# Patient Record
Sex: Female | Born: 1985 | Race: White | Hispanic: No | Marital: Married | Smoking: Never smoker
Health system: Southern US, Community
[De-identification: ages and names within clinical notes are randomized; demographics above are authoritative.]

## PROBLEM LIST (undated history)

## (undated) DIAGNOSIS — Z8759 Personal history of other complications of pregnancy, childbirth and the puerperium: Secondary | ICD-10-CM

## (undated) HISTORY — DX: Personal history of other complications of pregnancy, childbirth and the puerperium: Z87.59

## (undated) HISTORY — PX: WISDOM TOOTH EXTRACTION: SHX21

---

## 2021-08-14 ENCOUNTER — Encounter: Payer: Self-pay | Admitting: *Deleted

## 2021-08-23 ENCOUNTER — Other Ambulatory Visit: Payer: Self-pay

## 2021-08-23 ENCOUNTER — Encounter: Payer: Self-pay | Admitting: Certified Nurse Midwife

## 2021-08-23 ENCOUNTER — Other Ambulatory Visit (HOSPITAL_COMMUNITY)
Admission: RE | Admit: 2021-08-23 | Discharge: 2021-08-23 | Disposition: A | Discharge status: Home / Self Care | Payer: Medicaid Other | Source: Ambulatory Visit | Attending: Certified Nurse Midwife | Admitting: Certified Nurse Midwife

## 2021-08-23 ENCOUNTER — Ambulatory Visit (INDEPENDENT_AMBULATORY_CARE_PROVIDER_SITE_OTHER): Payer: Medicaid Other | Admitting: Certified Nurse Midwife

## 2021-08-23 VITALS — BP 104/62 | HR 67 | Ht 62.0 in | Wt 161.0 lb

## 2021-08-23 DIAGNOSIS — O0930 Supervision of pregnancy with insufficient antenatal care, unspecified trimester: Secondary | ICD-10-CM

## 2021-08-23 DIAGNOSIS — Z8759 Personal history of other complications of pregnancy, childbirth and the puerperium: Secondary | ICD-10-CM | POA: Insufficient documentation

## 2021-08-23 DIAGNOSIS — O09522 Supervision of elderly multigravida, second trimester: Secondary | ICD-10-CM

## 2021-08-23 DIAGNOSIS — O09299 Supervision of pregnancy with other poor reproductive or obstetric history, unspecified trimester: Secondary | ICD-10-CM

## 2021-08-23 DIAGNOSIS — Z3A21 21 weeks gestation of pregnancy: Secondary | ICD-10-CM

## 2021-08-23 DIAGNOSIS — Z3480 Encounter for supervision of other normal pregnancy, unspecified trimester: Secondary | ICD-10-CM

## 2021-08-23 DIAGNOSIS — O09529 Supervision of elderly multigravida, unspecified trimester: Secondary | ICD-10-CM | POA: Insufficient documentation

## 2021-08-23 NOTE — Progress Notes (Signed)
Subjective:   Mikayla Nash is a 35 y.o. [id number] at [redacted]w[redacted]d by LMP being seen today for her first obstetrical visit.  Her obstetrical history is significant for advanced maternal age. Patient does intend to breast feed. Pregnancy history fully reviewed.  Patient reports no complaints.  HISTORY: OB History  Gravida Para Term Preterm AB Living  6 3 3  0 2 3  SAB IAB Ectopic Multiple Live Births  2 0 0 0 0    # Outcome Date GA Lbr Len/2nd Weight Sex Delivery Anes PTL Lv  6 Current           5 SAB 12/2020 [redacted]w[redacted]d         4 SAB 08/2020 [redacted]w[redacted]d         3 Term 03/25/19 [redacted]w[redacted]d  8 lb 9.2 oz (3.89 kg) M Vag-Spont     2 Term 07/11/16 [redacted]w[redacted]d  10 lb 4 oz (4.65 kg) M Vag-Spont        Birth Comments: SD  1 Term 09/27/14 [redacted]w[redacted]d  8 lb 4.3 oz (3.75 kg) M Vag-Spont      Past Medical History:  Diagnosis Date  . History of miscarriage   . Shoulder dystocia during labor and delivery    Past Surgical History:  Procedure Laterality Date  . WISDOM TOOTH EXTRACTION     Family History  Problem Relation Age of Onset  . Diabetes Maternal Grandmother   . Diabetes Maternal Grandfather   . Dementia Paternal Grandmother    Social History   Tobacco Use  . Smoking status: Never  . Smokeless tobacco: Never  Vaping Use  . Vaping Use: Never used  Substance Use Topics  . Alcohol use: Never  . Drug use: Never   Not on File Current Outpatient Medications on File Prior to Visit  Medication Sig Dispense Refill  . aspirin EC 81 MG tablet Take 81 mg by mouth daily. Swallow whole.    . Prenatal Vit-Fe Fumarate-FA (MULTIVITAMIN-PRENATAL) 27-0.8 MG TABS tablet Take 1 tablet by mouth daily at 12 noon.    . progesterone (PROMETRIUM) 200 MG capsule Take 200 mg by mouth daily.     No current facility-administered medications on file prior to visit.    Indications for ASA therapy (per uptodate) One of the following: Previous pregnancy with preeclampsia, especially early onset and with an adverse outcome  No Multifetal gestation No Chronic hypertension No Type 1 or 2 diabetes mellitus No Chronic kidney disease No Autoimmune disease (antiphospholipid syndrome, systemic lupus erythematosus) No  Two or more of the following: Nulliparity No Obesity (body mass index >30 kg/m2) No Family history of preeclampsia in mother or sister No Age ?35 years Yes Sociodemographic characteristics (African American race, low socioeconomic level) No Personal risk factors (eg, previous pregnancy with low birth weight or small for gestational age infant, previous adverse pregnancy outcome [eg, stillbirth], interval >10 years between pregnancies) No  Indications for early 1 hour GTT (per uptodate)  BMI >25 (>23 in Asian women) AND one of the following  Gestational diabetes mellitus in a previous pregnancy No Glycated hemoglobin ?5.7 percent (39 mmol/mol), impaired glucose tolerance, or impaired fasting glucose on previous testing No First-degree relative with diabetes No High-risk race/ethnicity (eg, African American, Latino, Native American, M American, [place] Islander) No History of cardiovascular disease No Hypertension or on therapy for hypertension No High-density lipoprotein cholesterol level <35 mg/dL ([place] mmol/L) and/or a triglyceride level >250 mg/dL (1.24 mmol/L) No Polycystic ovary syndrome No Physical inactivity No Other  clinical condition associated with insulin resistance (eg, severe obesity, acanthosis nigricans) No Previous birth of an infant weighing ?4000 g Yes Previous stillbirth of unknown cause No   Exam   Vitals:   08/23/21 0933 08/23/21 0934  BP: 104/62   Pulse: 67   Weight: 161 lb (73 kg)   Height:  5\' 2"  (1.575 m)   Fetal Heart Rate (bpm): 156  Uterus:  Fundal Height: 22 cm  Pelvic Exam: Perineum:    Vulva:    Vagina:     Cervix:    Adnexa:    Bony Pelvis:   System: General: well-developed, well-nourished female in no acute distress   Breast:     Skin:  normal coloration and turgor, no rashes   Neurologic: oriented, normal, negative, normal mood   Extremities: normal strength, tone, and muscle mass, ROM of all joints is normal   HEENT extraocular movement intact and sclera clear, anicteric   Mouth/Teeth mucous membranes moist, pharynx normal without lesions and dental hygiene good   Neck supple and no masses   Cardiovascular: regular rate    Respiratory:  no respiratory distress, normal WOB   Abdomen: soft, non-tender; bowel sounds normal; no masses,  no organomegaly     Assessment:   Pregnancy: Patient Active Problem List   Diagnosis Date Noted  . Supervision of other normal pregnancy, antepartum 08/23/2021  . Advanced maternal age in multigravida 08/23/2021  . History of shoulder dystocia in prior pregnancy 08/23/2021  . Late prenatal care 08/23/2021  . History of macrosomia in infant in prior pregnancy, currently pregnant 08/23/2021     Plan:  1. Supervision of other normal pregnancy, antepartum - Obstetric panel - HIV antibody (with reflex) - Hepatitis C Antibody - Culture, OB Urine - GC/Chlamydia probe amp ([place])not at [facility] MFM OB DETAIL +14 WK; Future - Hgb A1C  2. Multigravida of advanced maternal age in second trimester - [place] MFM OB DETAIL +14 WK; Future  3. [redacted] weeks gestation of pregnancy  4. History of shoulder dystocia in prior pregnancy - weight 10'4, no GDM, reports no injury to baby  5. Late prenatal care - moved from OR  6. History of macrosomia in infant in prior pregnancy, currently pregnant - reports extra weight gain - no GDM - Hgb A1C   Initial labs drawn. Early A1C Has been taking bASA for previous SAB x2, ok to continue, may reduce risk of PEC Has been taking Prometrium, ok to stop, no benefit at this GA Continue prenatal vitamins. Genetic Screening discussed, First trimester screen, Quad screen and NIPS: declined. Ultrasound discussed; fetal anatomic survey:  requested. Problem list reviewed and updated The nature of [facility] with multiple MDs and other Advanced Practice Providers was explained to patient; also emphasized that fellows, residents, and students are part of our team. Desires midwives and low intervention Routine obstetric precautions reviewed Return in about 4 weeks (around 09/20/2021).   09/22/2021 11:56 AM 08/23/21

## 2021-08-23 NOTE — Progress Notes (Signed)
Pt declined genetic testing  Last pap 05/12/19- normal results

## 2021-08-26 LAB — GC/CHLAMYDIA PROBE AMP (~~LOC~~) NOT AT ARMC
Chlamydia: NEGATIVE
Comment: NEGATIVE
Comment: NORMAL
Neisseria Gonorrhea: NEGATIVE

## 2021-08-30 LAB — OBSTETRIC PANEL
Absolute Monocytes: 475 cells/uL (ref 200–950)
Antibody Screen: NOT DETECTED
Basophils Absolute: 43 cells/uL (ref 0–200)
Basophils Relative: 0.6 %
Eosinophils Absolute: 29 cells/uL (ref 15–500)
Eosinophils Relative: 0.4 %
HCT: 36.1 % (ref 35.0–45.0)
Hemoglobin: 12 g/dL (ref 11.7–15.5)
Hepatitis B Surface Ag: NONREACTIVE
Lymphs Abs: 1454 cells/uL (ref 850–3900)
MCH: 31.2 pg (ref 27.0–33.0)
MCHC: 33.2 g/dL (ref 32.0–36.0)
MCV: 93.8 fL (ref 80.0–100.0)
MPV: 10.6 fL (ref 7.5–12.5)
Monocytes Relative: 6.6 %
Neutro Abs: 5198 cells/uL (ref 1500–7800)
Neutrophils Relative %: 72.2 %
Platelets: 258 10*3/uL (ref 140–400)
RBC: 3.85 10*6/uL (ref 3.80–5.10)
RDW: 12.5 % (ref 11.0–15.0)
RPR Ser Ql: NONREACTIVE
Rubella: 3.03 Index
Total Lymphocyte: 20.2 %
WBC: 7.2 10*3/uL (ref 3.8–10.8)

## 2021-08-30 LAB — HEMOGLOBIN A1C
Hgb A1c MFr Bld: 4.4 % of total Hgb (ref <5.7)
Mean Plasma Glucose: 80 mg/dL
eAG (mmol/L): 4.4 mmol/L

## 2021-08-30 LAB — HEPATITIS C ANTIBODY
Hepatitis C Ab: NONREACTIVE
SIGNAL TO CUT-OFF: 0.09 (ref <1.00)

## 2021-08-30 LAB — CULTURE, OB URINE

## 2021-08-30 LAB — URINE CULTURE, OB REFLEX

## 2021-08-30 LAB — HIV ANTIBODY (ROUTINE TESTING W REFLEX): HIV 1&2 Ab, 4th Generation: NONREACTIVE

## 2021-09-03 ENCOUNTER — Encounter: Payer: Self-pay | Admitting: Certified Nurse Midwife

## 2021-09-03 DIAGNOSIS — R8271 Bacteriuria: Secondary | ICD-10-CM | POA: Insufficient documentation

## 2021-09-17 ENCOUNTER — Other Ambulatory Visit: Payer: Self-pay | Admitting: *Deleted

## 2021-09-17 ENCOUNTER — Encounter: Payer: Self-pay | Admitting: *Deleted

## 2021-09-17 ENCOUNTER — Ambulatory Visit: Payer: Medicaid Other | Attending: Certified Nurse Midwife

## 2021-09-17 ENCOUNTER — Encounter: Payer: Self-pay | Admitting: Certified Nurse Midwife

## 2021-09-17 ENCOUNTER — Ambulatory Visit: Payer: Medicaid Other | Admitting: *Deleted

## 2021-09-17 ENCOUNTER — Other Ambulatory Visit: Payer: Self-pay

## 2021-09-17 VITALS — BP 106/58 | HR 78

## 2021-09-17 DIAGNOSIS — O0932 Supervision of pregnancy with insufficient antenatal care, second trimester: Secondary | ICD-10-CM | POA: Insufficient documentation

## 2021-09-17 DIAGNOSIS — Z363 Encounter for antenatal screening for malformations: Secondary | ICD-10-CM | POA: Insufficient documentation

## 2021-09-17 DIAGNOSIS — O3660X0 Maternal care for excessive fetal growth, unspecified trimester, not applicable or unspecified: Secondary | ICD-10-CM | POA: Insufficient documentation

## 2021-09-17 DIAGNOSIS — O09299 Supervision of pregnancy with other poor reproductive or obstetric history, unspecified trimester: Secondary | ICD-10-CM

## 2021-09-17 DIAGNOSIS — R8271 Bacteriuria: Secondary | ICD-10-CM | POA: Diagnosis present

## 2021-09-17 DIAGNOSIS — Z8759 Personal history of other complications of pregnancy, childbirth and the puerperium: Secondary | ICD-10-CM

## 2021-09-17 DIAGNOSIS — O0930 Supervision of pregnancy with insufficient antenatal care, unspecified trimester: Secondary | ICD-10-CM

## 2021-09-17 DIAGNOSIS — O09292 Supervision of pregnancy with other poor reproductive or obstetric history, second trimester: Secondary | ICD-10-CM | POA: Diagnosis not present

## 2021-09-17 DIAGNOSIS — Z3480 Encounter for supervision of other normal pregnancy, unspecified trimester: Secondary | ICD-10-CM | POA: Diagnosis present

## 2021-09-17 DIAGNOSIS — O09522 Supervision of elderly multigravida, second trimester: Secondary | ICD-10-CM

## 2021-09-17 DIAGNOSIS — Z3689 Encounter for other specified antenatal screening: Secondary | ICD-10-CM

## 2021-09-17 DIAGNOSIS — Z3A25 25 weeks gestation of pregnancy: Secondary | ICD-10-CM | POA: Diagnosis not present

## 2021-09-20 ENCOUNTER — Ambulatory Visit (INDEPENDENT_AMBULATORY_CARE_PROVIDER_SITE_OTHER): Payer: Medicaid Other | Admitting: Certified Nurse Midwife

## 2021-09-20 ENCOUNTER — Other Ambulatory Visit: Payer: Self-pay

## 2021-09-20 VITALS — BP 111/65 | HR 97 | Wt 164.0 lb

## 2021-09-20 DIAGNOSIS — Z3A25 25 weeks gestation of pregnancy: Secondary | ICD-10-CM

## 2021-09-20 DIAGNOSIS — Z3480 Encounter for supervision of other normal pregnancy, unspecified trimester: Secondary | ICD-10-CM

## 2021-09-20 MED ORDER — BLOOD GLUCOSE MONITOR KIT: PACK | 0 refills | Status: DC

## 2021-09-20 NOTE — Progress Notes (Signed)
Subjective:  Mikayla Nash is a 35 y.o. [phone] at [redacted]w[redacted]d being seen today for ongoing prenatal care.  She is currently monitored for the following issues for this low-risk pregnancy and has Supervision of other normal pregnancy, antepartum; Advanced maternal age in multigravida; History of shoulder dystocia in prior pregnancy; Late prenatal care; History of macrosomia in infant in prior pregnancy, currently pregnant; GBS bacteriuria; and LGA (large for gestational age) fetus affecting management of mother on their problem list.  Patient reports no complaints.  Contractions: Irritability. Vag. Bleeding: None.  Movement: Present. Denies leaking of fluid.   The following portions of the patient's history were reviewed and updated as appropriate: allergies, current medications, past family history, past medical history, past social history, past surgical history and problem list. Problem list updated.  Objective:   Vitals:   09/20/21 1021  BP: 111/65  Pulse: 97  Weight: 164 lb (74.4 kg)    Fetal Status: Fetal Heart Rate (bpm): 145 Fundal Height: 27 cm Movement: Present     General:  Alert, oriented and cooperative. Patient is in no acute distress.  Skin: Skin is warm and dry. No rash noted.   Cardiovascular: Normal heart rate noted  Respiratory: Normal respiratory effort, no problems with respiration noted  Abdomen: Soft, gravid, appropriate for gestational age. Pain/Pressure: Absent     Pelvic: Vag. Bleeding: None Vag D/C Character: Thin   Cervical exam deferred        Extremities: Normal range of motion.  Edema: None  Mental Status: Normal mood and affect. Normal behavior. Normal judgment and thought content.   Urinalysis:      Assessment and Plan:  Pregnancy: [id number] at [redacted]w[redacted]d  1. Supervision of other normal pregnancy, antepartum - declines GTT> home glucose monitoring fasting and 2 hrs after meal, start at 27 wks (for 2 weeks)  2. [redacted] weeks gestation of pregnancy   Preterm  labor symptoms and general obstetric precautions including but not limited to vaginal bleeding, contractions, leaking of fluid and fetal movement were reviewed in detail with the patient. Please refer to After Visit Summary for other counseling recommendations.  Return in about 3 weeks (around 10/11/2021).   Donette Larry, CNM

## 2021-10-11 ENCOUNTER — Encounter: Payer: Medicaid Other | Admitting: Obstetrics and Gynecology

## 2021-10-18 ENCOUNTER — Ambulatory Visit (INDEPENDENT_AMBULATORY_CARE_PROVIDER_SITE_OTHER): Payer: Medicaid Other | Admitting: Certified Nurse Midwife

## 2021-10-18 ENCOUNTER — Other Ambulatory Visit: Payer: Self-pay

## 2021-10-18 VITALS — BP 97/61 | HR 90 | Wt 167.0 lb

## 2021-10-18 DIAGNOSIS — O3663X0 Maternal care for excessive fetal growth, third trimester, not applicable or unspecified: Secondary | ICD-10-CM

## 2021-10-18 DIAGNOSIS — Z3483 Encounter for supervision of other normal pregnancy, third trimester: Secondary | ICD-10-CM

## 2021-10-18 DIAGNOSIS — Z3480 Encounter for supervision of other normal pregnancy, unspecified trimester: Secondary | ICD-10-CM

## 2021-10-18 DIAGNOSIS — Z3A29 29 weeks gestation of pregnancy: Secondary | ICD-10-CM

## 2021-10-18 NOTE — Progress Notes (Signed)
Subjective:  Mikayla Nash is a 35 y.o. [phone] at [redacted]w[redacted]d being seen today for ongoing prenatal care.  She is currently monitored for the following issues for this low-risk pregnancy and has Supervision of other normal pregnancy, antepartum; Advanced maternal age in multigravida; History of shoulder dystocia in prior pregnancy; Late prenatal care; History of macrosomia in infant in prior pregnancy, currently pregnant; GBS bacteriuria; and LGA (large for gestational age) fetus affecting management of mother on their problem list.  Patient reports no complaints.  Contractions: Not present. Vag. Bleeding: None.  Movement: Present. Denies leaking of fluid.   The following portions of the patient's history were reviewed and updated as appropriate: allergies, current medications, past family history, past medical history, past social history, past surgical history and problem list. Problem list updated.  Objective:   Vitals:   10/18/21 0938  BP: 97/61  Pulse: 90  Weight: 167 lb (75.8 kg)    Fetal Status: Fetal Heart Rate (bpm): 137 Fundal Height: 32 cm Movement: Present     General:  Alert, oriented and cooperative. Patient is in no acute distress.  Skin: Skin is warm and dry. No rash noted.   Cardiovascular: Normal heart rate noted  Respiratory: Normal respiratory effort, no problems with respiration noted  Abdomen: Soft, gravid, appropriate for gestational age. Pain/Pressure: Absent     Pelvic: Vag. Bleeding: None Vag D/C Character: Thin   Cervical exam deferred        Extremities: Normal range of motion.  Edema: None  Mental Status: Normal mood and affect. Normal behavior. Normal judgment and thought content.   Urinalysis:      Assessment and Plan:  Pregnancy: [id number] at [redacted]w[redacted]d  1. Encounter for supervision of other normal pregnancy in third trimester  - HIV antibody (with reflex) - CBC - RPR  2. Supervision of other normal pregnancy, antepartum  3. [redacted] weeks gestation of  pregnancy  4. Excessive fetal growth affecting management of pregnancy in third trimester, single or unspecified fetus - S>D - FBS mid 80s to low 90s, pp all <118 (declined GTT); will upload to MyChart - recommend another week of home glucose monitoring - last [place] 97 %ile; growth [place] scheduled next month  Preterm labor symptoms and general obstetric precautions including but not limited to vaginal bleeding, contractions, leaking of fluid and fetal movement were reviewed in detail with the patient. Please refer to After Visit Summary for other counseling recommendations.  Return in about 3 weeks (around 11/08/2021).   Donette Larry, CNM

## 2021-10-31 ENCOUNTER — Encounter: Payer: Self-pay | Admitting: Certified Nurse Midwife

## 2021-11-05 ENCOUNTER — Telehealth: Payer: Self-pay

## 2021-11-05 ENCOUNTER — Other Ambulatory Visit: Payer: Medicaid Other

## 2021-11-05 NOTE — Telephone Encounter (Signed)
Pt called this morning and left message about itching (specifically on her feet) in pregnancy. I spoke to Sharen Counter, CNM who wants pt to come for labs today or tomorrow and then have pt come back on 1/10 to discuss results. I left pt detailed voicemail asking her to call office back to schedule lab visit for today or tomorrow and OB visit for 1/10.

## 2021-11-05 NOTE — Progress Notes (Signed)
Pt here for Bile Acids and CMP per Sharen Counter, CNM. Pt did not have 28 week labs done last time she was in office so pt was also given requisition for those labs and sent to lab.

## 2021-11-06 ENCOUNTER — Telehealth: Payer: Self-pay

## 2021-11-06 ENCOUNTER — Telehealth: Payer: Self-pay | Admitting: Advanced Practice Midwife

## 2021-11-06 NOTE — Telephone Encounter (Signed)
Reviewed pt CMP results with elevated liver enzymes.  Pt with reported itching came to [facility] office on 11/05/21 for labwork.  I saw that pt called the office today reporting she doesn't feel well and she has decreased fetal movement.  I called Mikayla Nash tonight and left a message regarding her abnormal lab.  I recommended she come to [facility] right away if she continues to report decreased movement.

## 2021-11-06 NOTE — Telephone Encounter (Signed)
Pt came for labs yesterday (bile acids and cmp) due to itching in pregnancy. Pt's OB appt was moved to 1/10 to discuss the results per Tory Emerald, CNM. Pt called today stating that she doesn't feel well, she has noticed changes in her urine and fetal movement had decreased a small amount. Pt states she is still feeling fetal movement but, feels as if it may not be as much and she isn't sure if it is just from the baby getting bigger and running out of room or if something is wrong. Pt states she just doesn't feel good in general and wants to be seen. Due to no provider in the office we are unable to see her today. I told pt that with any complaint of decreased movement we would recommend evaluation at [facility]. I offered pt appt tomorrow morning with Dr.Leggett if she wants to discuss concerns further but, she should also go be evaluated today. Pt states she definitely feels like she needs to be seen today and agrees with plan to go to hospital. Pt states she would rather go to [facility] due to it being closer to her house. I recommended [facility] but, reassured pt to be seen wherever she feels comfortable going. Pt expressed understanding and will go to hospital. Pt will call us with any further concerns or need for earlier appt.

## 2021-11-07 ENCOUNTER — Other Ambulatory Visit: Payer: Self-pay | Admitting: Advanced Practice Midwife

## 2021-11-07 MED ORDER — HYDROXYZINE PAMOATE 25 MG PO CAPS: 25.0000 mg | ORAL_CAPSULE | Freq: Three times a day (TID) | ORAL | 2 refills | Status: AC | PRN

## 2021-11-07 MED ORDER — URSODIOL 300 MG PO CAPS: 300.0000 mg | ORAL_CAPSULE | Freq: Three times a day (TID) | ORAL | 1 refills | Status: AC

## 2021-11-08 ENCOUNTER — Other Ambulatory Visit: Payer: Self-pay

## 2021-11-08 ENCOUNTER — Telehealth: Payer: Self-pay

## 2021-11-08 ENCOUNTER — Ambulatory Visit: Payer: Medicaid Other | Admitting: *Deleted

## 2021-11-08 ENCOUNTER — Ambulatory Visit: Payer: Medicaid Other | Attending: Obstetrics and Gynecology

## 2021-11-08 VITALS — BP 110/64 | HR 79

## 2021-11-08 DIAGNOSIS — O26613 Liver and biliary tract disorders in pregnancy, third trimester: Secondary | ICD-10-CM | POA: Insufficient documentation

## 2021-11-08 DIAGNOSIS — R8271 Bacteriuria: Secondary | ICD-10-CM | POA: Diagnosis present

## 2021-11-08 DIAGNOSIS — Z362 Encounter for other antenatal screening follow-up: Secondary | ICD-10-CM | POA: Diagnosis not present

## 2021-11-08 DIAGNOSIS — Z3480 Encounter for supervision of other normal pregnancy, unspecified trimester: Secondary | ICD-10-CM | POA: Insufficient documentation

## 2021-11-08 DIAGNOSIS — O09299 Supervision of pregnancy with other poor reproductive or obstetric history, unspecified trimester: Secondary | ICD-10-CM

## 2021-11-08 DIAGNOSIS — O09523 Supervision of elderly multigravida, third trimester: Secondary | ICD-10-CM

## 2021-11-08 DIAGNOSIS — O0933 Supervision of pregnancy with insufficient antenatal care, third trimester: Secondary | ICD-10-CM | POA: Insufficient documentation

## 2021-11-08 DIAGNOSIS — K831 Obstruction of bile duct: Secondary | ICD-10-CM | POA: Diagnosis not present

## 2021-11-08 DIAGNOSIS — Z3A32 32 weeks gestation of pregnancy: Secondary | ICD-10-CM | POA: Insufficient documentation

## 2021-11-08 DIAGNOSIS — O09293 Supervision of pregnancy with other poor reproductive or obstetric history, third trimester: Secondary | ICD-10-CM | POA: Insufficient documentation

## 2021-11-08 DIAGNOSIS — Z8759 Personal history of other complications of pregnancy, childbirth and the puerperium: Secondary | ICD-10-CM

## 2021-11-08 DIAGNOSIS — O0930 Supervision of pregnancy with insufficient antenatal care, unspecified trimester: Secondary | ICD-10-CM

## 2021-11-08 DIAGNOSIS — O3663X0 Maternal care for excessive fetal growth, third trimester, not applicable or unspecified: Secondary | ICD-10-CM | POA: Diagnosis present

## 2021-11-08 DIAGNOSIS — O09522 Supervision of elderly multigravida, second trimester: Secondary | ICD-10-CM | POA: Diagnosis present

## 2021-11-08 DIAGNOSIS — O99613 Diseases of the digestive system complicating pregnancy, third trimester: Secondary | ICD-10-CM | POA: Diagnosis not present

## 2021-11-08 NOTE — Telephone Encounter (Addendum)
I spoke with pt and she states she was seen at [facility] ED on 11/06/21. Pt states the doctor prescribed Ursodiol for her from the hospital and she is taking it. Pt states she is feeling better and endorses fetal movement. Per Venia Carbon, NP who is in the office today if pt is feeling okay and feeling fetal movement then she is okay to just keep 1/10 appt. Pt is aware to come to appt on 1/10. Pt is also aware to go to [facility] if anything changes over the weekend and if she notices any decreased fetal movement. Pt expressed understanding.   ----- Message from Hurshel Party, CNM sent at 11/07/2021  7:33 PM EST ----- Regarding: call patient with cholestasis Mikayla Nash,  This patient had labs drawn on Tuesday when I was in the office and her CMP is back but not her bile acids.  Her liver enzymes are elevated so she probably does have cholestasis. I saw that she talked to you in the office yesterday with decreased fetal movement and you wanted her to come to [facility]. I called her last night too and left a message about her abnormal labs.  Can you call her again Friday during office hours?  If the provider can see her Friday, it would be best if she came in, but I will send her Ursodiol 300 mg to take TID and Vistaril PRN for itching.  I can still see her Tuesday and we can talk about the diagnosis and plan for delivery.  If she has decreased fetal movement again she should come to [facility].  Thank you!  Mikayla Nash

## 2021-11-11 ENCOUNTER — Other Ambulatory Visit: Payer: Self-pay | Admitting: *Deleted

## 2021-11-11 DIAGNOSIS — O09523 Supervision of elderly multigravida, third trimester: Secondary | ICD-10-CM

## 2021-11-11 DIAGNOSIS — K831 Obstruction of bile duct: Secondary | ICD-10-CM

## 2021-11-11 DIAGNOSIS — O26643 Intrahepatic cholestasis of pregnancy, third trimester: Secondary | ICD-10-CM

## 2021-11-11 DIAGNOSIS — O09299 Supervision of pregnancy with other poor reproductive or obstetric history, unspecified trimester: Secondary | ICD-10-CM

## 2021-11-11 DIAGNOSIS — O0933 Supervision of pregnancy with insufficient antenatal care, third trimester: Secondary | ICD-10-CM

## 2021-11-12 ENCOUNTER — Ambulatory Visit (INDEPENDENT_AMBULATORY_CARE_PROVIDER_SITE_OTHER): Payer: Medicaid Other | Admitting: Advanced Practice Midwife

## 2021-11-12 VITALS — BP 102/51 | HR 87 | Wt 164.0 lb

## 2021-11-12 DIAGNOSIS — O26613 Liver and biliary tract disorders in pregnancy, third trimester: Secondary | ICD-10-CM

## 2021-11-12 DIAGNOSIS — K831 Obstruction of bile duct: Secondary | ICD-10-CM | POA: Diagnosis not present

## 2021-11-12 DIAGNOSIS — Z3A33 33 weeks gestation of pregnancy: Secondary | ICD-10-CM

## 2021-11-12 DIAGNOSIS — O0990 Supervision of high risk pregnancy, unspecified, unspecified trimester: Secondary | ICD-10-CM

## 2021-11-12 NOTE — Progress Notes (Signed)
PRENATAL VISIT NOTE  Subjective:  Mikayla Nash is a 36 y.o. [phone] at [redacted]w[redacted]d being seen today for ongoing prenatal care.  She is currently monitored for the following issues for this high-risk pregnancy and has Supervision of other normal pregnancy, antepartum; Advanced maternal age in multigravida; History of shoulder dystocia in prior pregnancy; Late prenatal care; History of macrosomia in infant in prior pregnancy, currently pregnant; GBS bacteriuria; and LGA (large for gestational age) fetus affecting management of mother on their problem list.  Patient reports no complaints.  Contractions: Not present. Vag. Bleeding: None.  Movement: Present. Denies leaking of fluid.   The following portions of the patient's history were reviewed and updated as appropriate: allergies, current medications, past family history, past medical history, past social history, past surgical history and problem list.   Objective:   Vitals:   11/12/21 1059  BP: (!) 102/51  Pulse: 87  Weight: 164 lb (74.4 kg)    Fetal Status: Fetal Heart Rate (bpm): 153 Fundal Height: 35 cm Movement: Present     General:  Alert, oriented and cooperative. Patient is in no acute distress.  Skin: Skin is warm and dry. No rash noted.   Cardiovascular: Normal heart rate noted  Respiratory: Normal respiratory effort, no problems with respiration noted  Abdomen: Soft, gravid, appropriate for gestational age.  Pain/Pressure: Absent     Pelvic: Cervical exam deferred        Extremities: Normal range of motion.  Edema: None  Mental Status: Normal mood and affect. Normal behavior. Normal judgment and thought content.   Assessment and Plan:  Pregnancy: [id number] at [redacted]w[redacted]d 1. Cholestasis during pregnancy in third trimester --Pt to start antenatal testing this week, offered twice weekly in [place] or weekly with MFM --Pt prefers weekly and is scheduled 1/13 at MFM for BPP  - Bile acids, total - Comp Met (CMET)  2.  Supervision of high risk pregnancy, antepartum --Anticipatory guidance about next visits/weeks of pregnancy given. --Pt interested in low intervention but now has cholestasis. Discussed recommendation for IOL at 37 weeks, pt had read about this and was aware and prepared for plan for IOL.  However, pt was hoping for waterbirth or immersion in water for labor.  No prior discussion of this in chart and pt has not taken class.  Discussed the dx of cholestasis will require continuous EFM and is a contraindication to water immersion.  Pt is upset at this news.  Questions were answered and pt to consider transferring to practice closer to home if water immersion is unavailable with [place].  --Next visit scheduled in 2 weeks with antenatal testing this Friday  3. [redacted] weeks gestation of pregnancy   Preterm labor symptoms and general obstetric precautions including but not limited to vaginal bleeding, contractions, leaking of fluid and fetal movement were reviewed in detail with the patient. Please refer to After Visit Summary for other counseling recommendations.   Return in about 2 weeks (around 11/26/2021).  Future Appointments  Date Time Provider Department Center  11/15/2021  3:45 PM [facility] NURSE [facility]  11/15/2021  4:00 PM [facility] NST [facility]  11/22/2021  2:15 PM [facility] NURSE [facility]  11/22/2021  2:30 PM [facility] US3 [facility]  11/29/2021 10:00 AM [facility] NURSE [facility]  11/29/2021 10:15 AM [facility] US2 [facility]  12/03/2021 10:10 AM Rasch, Harolyn Rutherford, NP [facility]  12/06/2021 10:15 AM [facility] NURSE [facility]  12/06/2021 10:30 AM [facility] US3 [facility]    Sharen Counter,  CNM

## 2021-11-14 ENCOUNTER — Encounter: Payer: Self-pay | Admitting: Advanced Practice Midwife

## 2021-11-15 ENCOUNTER — Other Ambulatory Visit: Payer: Self-pay | Admitting: Obstetrics

## 2021-11-15 ENCOUNTER — Other Ambulatory Visit: Payer: Self-pay

## 2021-11-15 ENCOUNTER — Encounter: Payer: Medicaid Other | Admitting: Certified Nurse Midwife

## 2021-11-15 ENCOUNTER — Ambulatory Visit: Payer: Medicaid Other | Attending: Obstetrics and Gynecology | Admitting: *Deleted

## 2021-11-15 ENCOUNTER — Ambulatory Visit (HOSPITAL_BASED_OUTPATIENT_CLINIC_OR_DEPARTMENT_OTHER): Payer: Medicaid Other

## 2021-11-15 ENCOUNTER — Ambulatory Visit (HOSPITAL_BASED_OUTPATIENT_CLINIC_OR_DEPARTMENT_OTHER): Payer: Medicaid Other | Admitting: *Deleted

## 2021-11-15 VITALS — BP 109/62 | HR 90

## 2021-11-15 DIAGNOSIS — K831 Obstruction of bile duct: Secondary | ICD-10-CM | POA: Insufficient documentation

## 2021-11-15 DIAGNOSIS — R8271 Bacteriuria: Secondary | ICD-10-CM

## 2021-11-15 DIAGNOSIS — O26613 Liver and biliary tract disorders in pregnancy, third trimester: Secondary | ICD-10-CM

## 2021-11-15 DIAGNOSIS — O09523 Supervision of elderly multigravida, third trimester: Secondary | ICD-10-CM

## 2021-11-15 DIAGNOSIS — Z3A33 33 weeks gestation of pregnancy: Secondary | ICD-10-CM | POA: Diagnosis not present

## 2021-11-15 DIAGNOSIS — O09293 Supervision of pregnancy with other poor reproductive or obstetric history, third trimester: Secondary | ICD-10-CM | POA: Insufficient documentation

## 2021-11-15 DIAGNOSIS — O0930 Supervision of pregnancy with insufficient antenatal care, unspecified trimester: Secondary | ICD-10-CM

## 2021-11-15 DIAGNOSIS — O26643 Intrahepatic cholestasis of pregnancy, third trimester: Secondary | ICD-10-CM

## 2021-11-15 DIAGNOSIS — Z3480 Encounter for supervision of other normal pregnancy, unspecified trimester: Secondary | ICD-10-CM

## 2021-11-15 DIAGNOSIS — Z8759 Personal history of other complications of pregnancy, childbirth and the puerperium: Secondary | ICD-10-CM

## 2021-11-15 DIAGNOSIS — O09299 Supervision of pregnancy with other poor reproductive or obstetric history, unspecified trimester: Secondary | ICD-10-CM

## 2021-11-15 DIAGNOSIS — O3663X0 Maternal care for excessive fetal growth, third trimester, not applicable or unspecified: Secondary | ICD-10-CM

## 2021-11-15 NOTE — Procedures (Signed)
Mikayla Nash 04-Mar-1986 [redacted]w[redacted]d  Fetus A Non-Stress Test Interpretation for 11/15/21  Indication:  cholestasis  Fetal Heart Rate A Mode: External Baseline Rate (A): 135 bpm Variability: Moderate Accelerations: 15 x 15 Decelerations: None  Uterine Activity Mode: Toco Contraction Frequency (min): 7-10 Contraction Duration (sec): 70-80 Contraction Quality: Mild (not felt by pt) Resting Tone Palpated: Relaxed  Interpretation (Fetal Testing) Nonstress Test Interpretation: Reactive Overall Impression: Reassuring for gestational age Comments: tracing reviewed by Dr. Parke Poisson

## 2021-11-18 ENCOUNTER — Other Ambulatory Visit: Payer: Self-pay

## 2021-11-18 ENCOUNTER — Telehealth: Payer: Self-pay | Admitting: *Deleted

## 2021-11-18 ENCOUNTER — Telehealth: Payer: Self-pay

## 2021-11-18 DIAGNOSIS — L299 Pruritus, unspecified: Secondary | ICD-10-CM

## 2021-11-18 DIAGNOSIS — Z3480 Encounter for supervision of other normal pregnancy, unspecified trimester: Secondary | ICD-10-CM

## 2021-11-18 NOTE — Telephone Encounter (Signed)
LM to call the office so that we can get her Bile acids drawn through [facility] since [facility] is having issues with reagent.

## 2021-11-18 NOTE — Telephone Encounter (Signed)
Pt returned call about NST and Bile Acids. Pt will go to [facility] in [place] for Bile Acids. Pt does not think she can go today so she may go tomorrow. Pt will talk to husband and let us know. Pt is unsure about wanting to come for NST. Message sent to Sharen Counter, CNM to clarify testing with pt.

## 2021-11-18 NOTE — Telephone Encounter (Signed)
-----   Message from Elvera Maria, CNM sent at 11/18/2021  1:15 PM EST ----- Regarding: RE: bile acids result Why didn't they notify us that they couldn't run the lab?  That's frustrating.  We made the diagnosis already but sometimes we are waiting for this result, you know?  Yes, I want to send her to [facility] to get the bile acids but I am concerned that she is transferring back to [facility] or somewhere closer to home.  We can call her and see if she will do the lab, even if she does not follow up with [place]. She was not happy that she could not use the tub/have a waterbirth with cholestasis.  Just as a heads up.  Thank you!   ----- Message ----- From: Asencion Islam, RN Sent: 11/18/2021   9:40 AM EST To: Elvera Maria, CNM Subject: FW: bile acids result                          We got a notice from [facility] on Friday that they were having trouble getting the reagent from the vendor to run the Bile Acids.  We can send her to [employer] [facility] [facility]  if you want. ----- Message ----- From: Elvera Maria, CNM Sent: 11/17/2021   3:30 PM EST To: Will[facility] Clinical Pool Subject: bile acids result                              This is the patient who we diagnosed with cholestasis because of elevated liver enzymes and itching. Her bile acids from 1/3 and from 1/10 are still not back.  Can we follow up again with lab?  She may be transferring to another practice since we do not allow water immersion or waterbirth for cholestasis but I want to get a bile acids number to know when to recommend delivery.  Also, I thought she had a BPP with MFM on 1/13 but all I see in the chart is an NST.  Looks like she is scheduled a BPP on 1/20.  Why did they not start with a BPP?  I would like another NST this week (Monday or Tuesday) since they only did an NST last week. Please call her to get her in for the NST and let me know what you find out about the Bile acids.  Thank you!

## 2021-11-18 NOTE — Telephone Encounter (Signed)
Attempted to call pt to schedule NST for tomorrow 11/19/21 and to get her [facility] order for Bile Acids since [facility] is delayed in resulting her labs. Pt did not answer. Voicemail left asking pt to return call to office to schedule NST for 11/19/21 and to let her know that we will give her a [facility] order requisition for Bile Acids to have drawn. Office number provided for call back. MyChart message also sent.

## 2021-11-18 NOTE — Progress Notes (Signed)
Bile Acids Order placed per Sharen Counter, CNM

## 2021-11-19 ENCOUNTER — Telehealth: Payer: Self-pay | Admitting: Advanced Practice Midwife

## 2021-11-19 NOTE — Telephone Encounter (Signed)
Pt called office to ask about additional scheduling for NST this week. Pt was sent to MFM last week to begin antenatal testing but had NST/AFI with Limited OB US so additional NST was ordered for twice weekly testing. BPPs are ordered starting 11/22/21 for weekly testing.  I called and spoke with Dr Parke Poisson with MFM who stated that NST/AFI was done due to MFM scheduling and that pt did not need additional NST prior to her BPP on 11/22/21.  I left message for pt today stating that she could keep MFM appointment on 1/20 but did not need another NST in our office.    Pt was sent to [facility] and bile acids were redrawn on 11/18/21 with results pending.

## 2021-11-20 ENCOUNTER — Encounter: Payer: Self-pay | Admitting: *Deleted

## 2021-11-20 LAB — BILE ACIDS, TOTAL: Bile Acids Total: 12.2 umol/L (ref 0.0–10.0)

## 2021-11-22 ENCOUNTER — Ambulatory Visit: Payer: Medicaid Other | Admitting: *Deleted

## 2021-11-22 ENCOUNTER — Other Ambulatory Visit: Payer: Self-pay

## 2021-11-22 ENCOUNTER — Ambulatory Visit: Payer: Medicaid Other | Attending: Obstetrics and Gynecology

## 2021-11-22 VITALS — BP 114/62 | HR 78

## 2021-11-22 DIAGNOSIS — O26613 Liver and biliary tract disorders in pregnancy, third trimester: Secondary | ICD-10-CM | POA: Insufficient documentation

## 2021-11-22 DIAGNOSIS — O09523 Supervision of elderly multigravida, third trimester: Secondary | ICD-10-CM

## 2021-11-22 DIAGNOSIS — O26643 Intrahepatic cholestasis of pregnancy, third trimester: Secondary | ICD-10-CM

## 2021-11-22 DIAGNOSIS — R8271 Bacteriuria: Secondary | ICD-10-CM

## 2021-11-22 DIAGNOSIS — O0933 Supervision of pregnancy with insufficient antenatal care, third trimester: Secondary | ICD-10-CM | POA: Diagnosis not present

## 2021-11-22 DIAGNOSIS — O09299 Supervision of pregnancy with other poor reproductive or obstetric history, unspecified trimester: Secondary | ICD-10-CM | POA: Diagnosis not present

## 2021-11-22 DIAGNOSIS — K831 Obstruction of bile duct: Secondary | ICD-10-CM | POA: Insufficient documentation

## 2021-11-22 DIAGNOSIS — Z3A34 34 weeks gestation of pregnancy: Secondary | ICD-10-CM | POA: Diagnosis not present

## 2021-11-22 DIAGNOSIS — Z3480 Encounter for supervision of other normal pregnancy, unspecified trimester: Secondary | ICD-10-CM

## 2021-11-22 DIAGNOSIS — O3663X0 Maternal care for excessive fetal growth, third trimester, not applicable or unspecified: Secondary | ICD-10-CM | POA: Insufficient documentation

## 2021-11-22 DIAGNOSIS — O0930 Supervision of pregnancy with insufficient antenatal care, unspecified trimester: Secondary | ICD-10-CM

## 2021-11-22 DIAGNOSIS — Z8759 Personal history of other complications of pregnancy, childbirth and the puerperium: Secondary | ICD-10-CM

## 2021-11-28 ENCOUNTER — Telehealth: Payer: Medicaid Other | Admitting: Obstetrics and Gynecology

## 2021-11-28 LAB — COMPREHENSIVE METABOLIC PANEL
AG Ratio: 1.1 (calc) (ref 1.0–2.5)
AG Ratio: 1.3 (calc) (ref 1.0–2.5)
ALT: 156 U/L — ABNORMAL HIGH (ref 6–29)
ALT: 124 U/L — ABNORMAL HIGH (ref 6–29)
AST: 92 U/L — ABNORMAL HIGH (ref 10–30)
AST: 43 U/L — ABNORMAL HIGH (ref 10–30)
Albumin: 3.2 g/dL — ABNORMAL LOW (ref 3.6–5.1)
Albumin: 3.3 g/dL — ABNORMAL LOW (ref 3.6–5.1)
Alkaline phosphatase (APISO): 80 U/L (ref 31–125)
Alkaline phosphatase (APISO): 82 U/L (ref 31–125)
BUN/Creatinine Ratio: 13 (calc) (ref 6–22)
BUN/Creatinine Ratio: 15 (calc) (ref 6–22)
BUN: 6 mg/dL — ABNORMAL LOW (ref 7–25)
BUN: 7 mg/dL (ref 7–25)
CO2: 23 mmol/L (ref 20–32)
CO2: 23 mmol/L (ref 20–32)
Calcium: 8.4 mg/dL — ABNORMAL LOW (ref 8.6–10.2)
Calcium: 9 mg/dL (ref 8.6–10.2)
Chloride: 105 mmol/L (ref 98–110)
Chloride: 105 mmol/L (ref 98–110)
Creat: 0.47 mg/dL — ABNORMAL LOW (ref 0.50–0.97)
Creat: 0.47 mg/dL — ABNORMAL LOW (ref 0.50–0.97)
Globulin: 2.8 g/dL (calc) (ref 1.9–3.7)
Globulin: 2.6 g/dL (calc) (ref 1.9–3.7)
Glucose, Bld: 78 mg/dL (ref 65–139)
Glucose, Bld: 81 mg/dL (ref 65–139)
Potassium: 4 mmol/L (ref 3.5–5.3)
Potassium: 4.1 mmol/L (ref 3.5–5.3)
Sodium: 137 mmol/L (ref 135–146)
Sodium: 136 mmol/L (ref 135–146)
Total Bilirubin: 0.6 mg/dL (ref 0.2–1.2)
Total Bilirubin: 0.4 mg/dL (ref 0.2–1.2)
Total Protein: 6 g/dL — ABNORMAL LOW (ref 6.1–8.1)
Total Protein: 5.9 g/dL — ABNORMAL LOW (ref 6.1–8.1)

## 2021-11-28 LAB — TIQ-MISC

## 2021-11-28 LAB — BILE ACIDS, TOTAL

## 2021-11-29 ENCOUNTER — Telehealth: Payer: Medicaid Other | Admitting: Certified Nurse Midwife

## 2021-11-29 ENCOUNTER — Encounter: Payer: Self-pay | Admitting: Advanced Practice Midwife

## 2021-11-29 ENCOUNTER — Ambulatory Visit: Payer: Medicaid Other

## 2021-11-29 DIAGNOSIS — K831 Obstruction of bile duct: Secondary | ICD-10-CM | POA: Insufficient documentation

## 2021-12-03 ENCOUNTER — Encounter: Payer: Medicaid Other | Admitting: Obstetrics and Gynecology

## 2021-12-06 ENCOUNTER — Ambulatory Visit: Payer: Medicaid Other

## 2021-12-06 ENCOUNTER — Other Ambulatory Visit: Payer: Medicaid Other

## 2023-02-10 IMAGING — US US MFM OB FOLLOW-UP
1 series · 13 of 28 positions shown · non-contrast
Comparison: none

[Series 1: us mfm ob follow-up · 38 acquisitions, 13 frames shown]
[im 2/38]
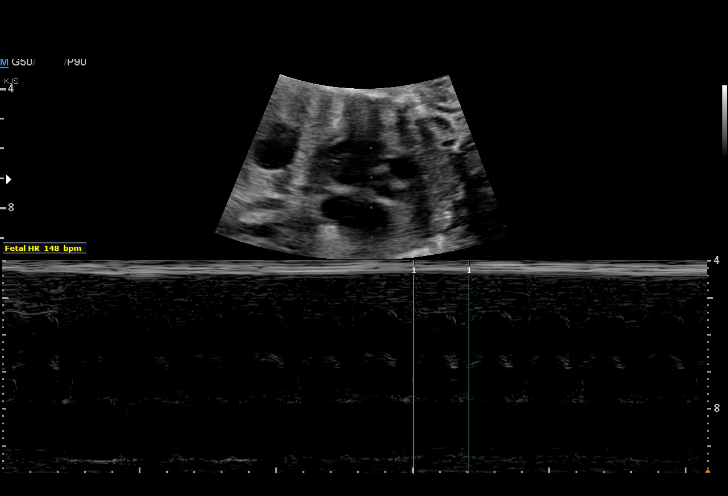
[im 5/38]
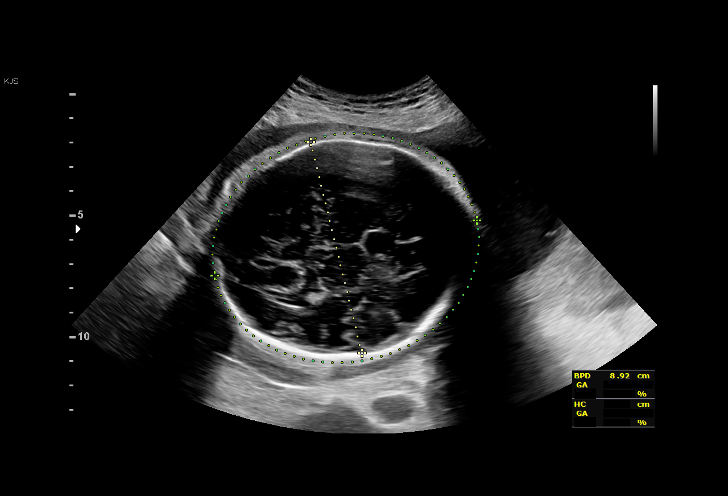
[im 7/38]
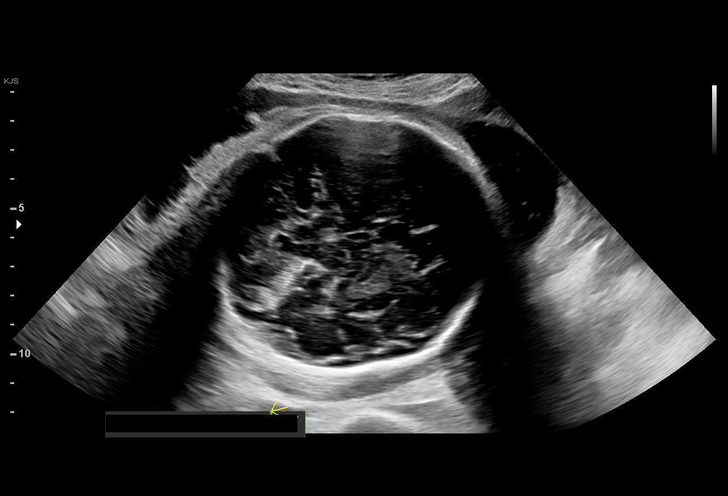
[im 10/38]
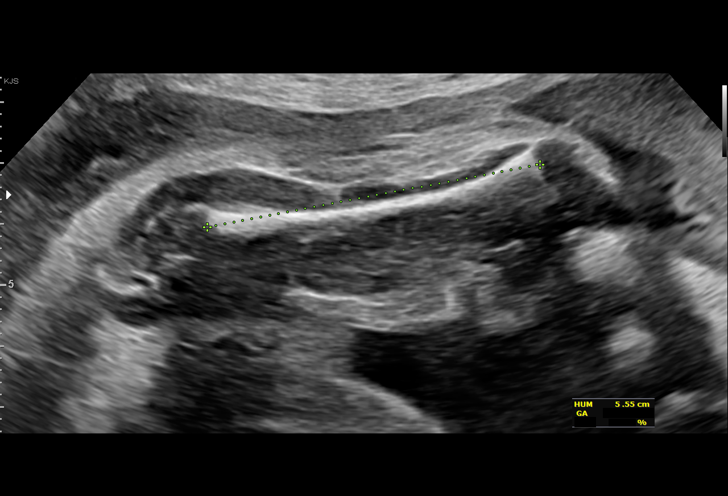
[im 13/38]
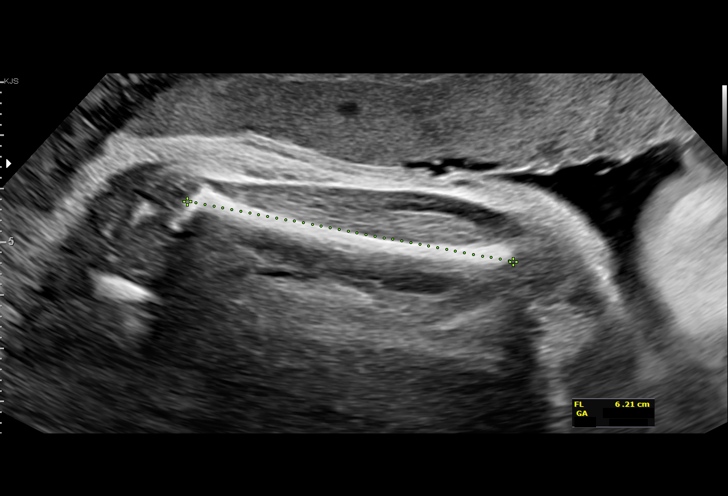
[im 16/38]
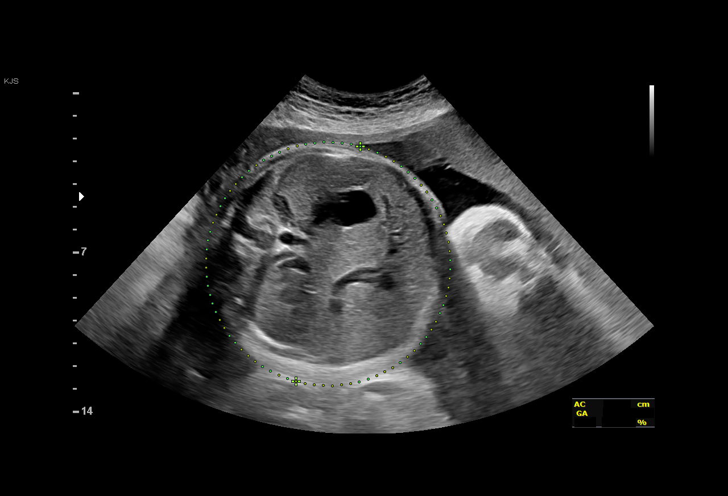
[im 20/38]
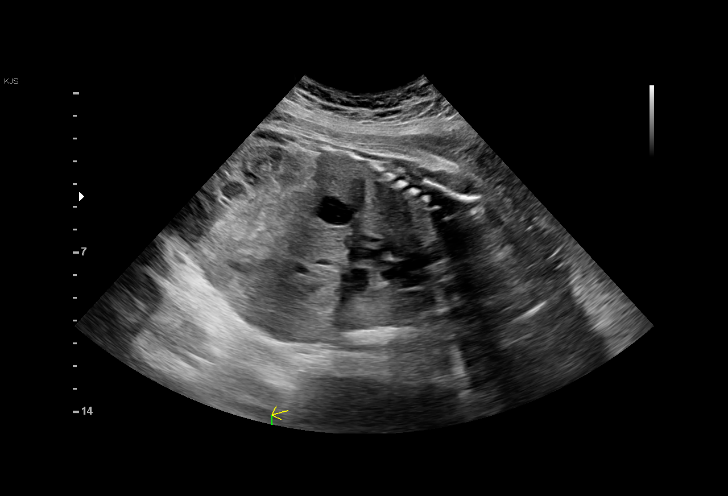
[im 22/38]
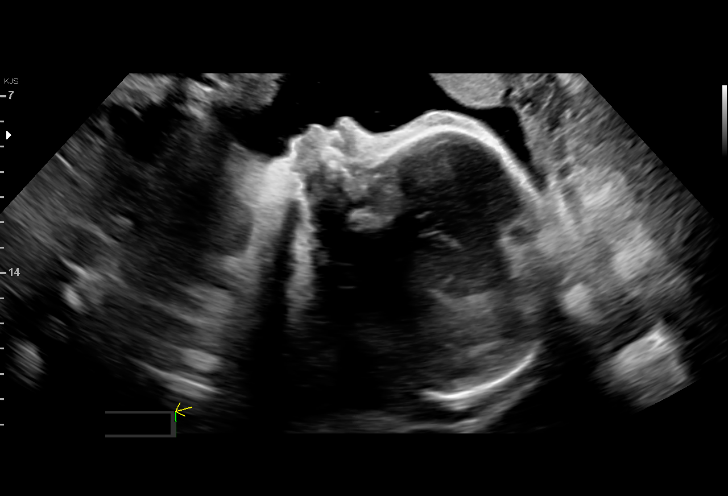
[im 25/38]
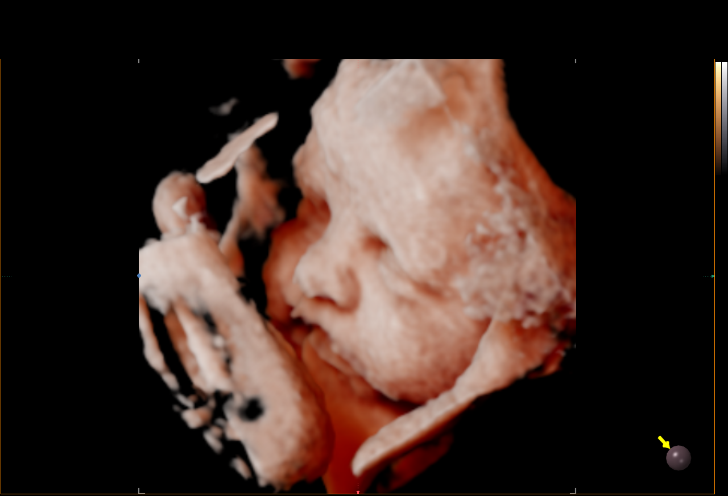
[im 28/38]
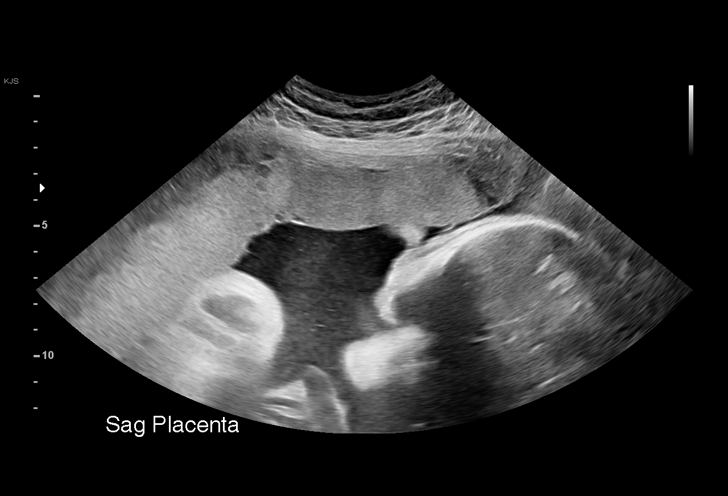
[im 31/38]
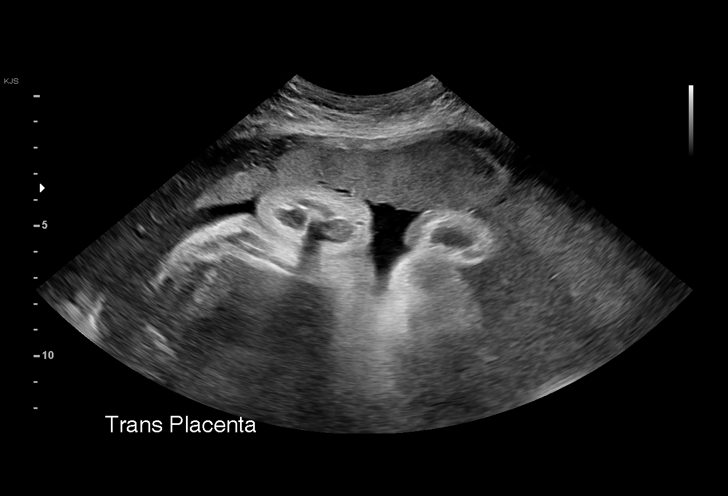
[im 33/38]
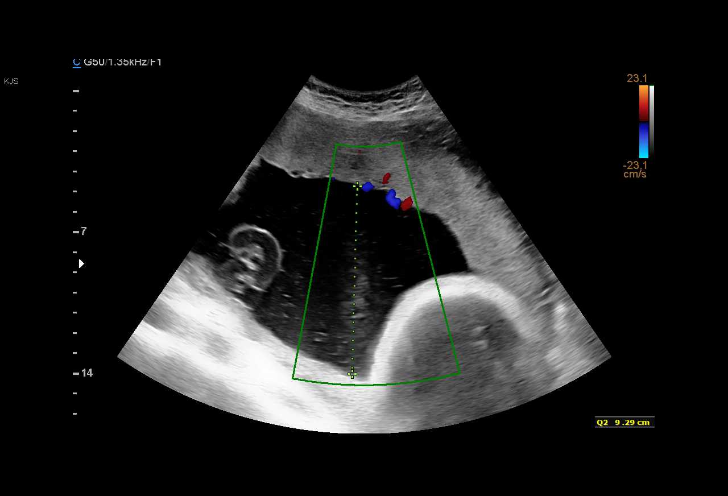
[im 36/38]
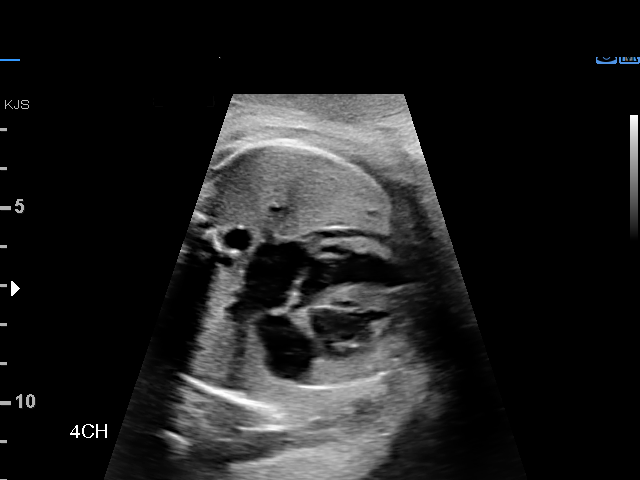

[13 of 28 positions shown; findings below may reference images not displayed]

[id number]

Indications

 Advanced maternal age multigravida 35+,
 third trimester (35 yo)
 Cholestasis of pregnancy, third trimester      [id number]
 Poor obstetrical history (Shoulder Dystocia)
 Late to prenatal care, third trimester
 Poor obstetric history: Prior fetal
 macrosomia, antepartum (1073 g)
 Encounter for other antenatal screening
 follow-up
 32 weeks gestation of pregnancy
Fetal Evaluation

 Num Of Fetuses:         1
 Fetal Heart Rate(bpm):  148
 Cardiac Activity:       Observed
 Presentation:           Cephalic
 Placenta:               Anterior
 P. Cord Insertion:      Previously Visualized

 Amniotic Fluid
 AFI FV:      Within normal limits

 AFI Sum(cm)     %Tile       Largest Pocket(cm)
 22.94           88

 RUQ(cm)       RLQ(cm)       LUQ(cm)        LLQ(cm)

Biometry

 BPD:      88.7  mm     G. Age:  35w 6d         98  %    CI:         76.5   %    70 - 86
                                                         FL/HC:      19.2   %    19.9 -
 HC:      321.3  mm     G. Age:  36w 2d         92  %    HC/AC:      0.96        0.96 -
 AC:       333   mm     G. Age:  37w 1d       > 99  %    FL/BPD:     69.4   %    71 - 87
 FL:       61.6  mm     G. Age:  32w 0d         17  %    FL/AC:      18.5   %    20 - 24
 HUM:      55.7  mm     G. Age:  32w 3d         48  %

 LV:        2.8  mm

 Est. FW:    9590  gm      6 lb 1 oz     99  %
OB History

 Gravidity:    6         Term:   3        Prem:   0        SAB:   2
 TOP:          0       Ectopic:  0        Living: 3
Gestational Age

 LMP:           32w 6d        Date:  03/23/21                 EDD:   12/28/21
 U/S Today:     35w 2d                                        EDD:   12/11/21
 Best:          32w 6d     Det. By:  LMP  (03/23/21)          EDD:   12/28/21
Anatomy

 Cranium:               Appears normal         Aortic Arch:            Previously seen
 Cavum:                 Appears normal         Ductal Arch:            Previously seen
 Ventricles:            Appears normal         Diaphragm:              Appears normal
 Choroid Plexus:        Previously seen        Stomach:                Appears normal, left
                                                                       sided
 Cerebellum:            Previously seen        Abdomen:                Appears normal
 Posterior Fossa:       Previously seen        Abdominal Wall:         Previously seen
 Nuchal Fold:           Previously seen        Cord Vessels:           Previously seen
 Face:                  Orbits and profile     Kidneys:                Appear normal
                        previously seen
 Lips:                  Previously seen        Bladder:                Appears normal
 Thoracic:              Appears normal         Spine:                  Previously seen
 Heart:                 Appears normal         Upper Extremities:      Previously seen
                        (4CH, axis, and
                        situs)
 RVOT:                  Previously seen        Lower Extremities:      Previously seen
 LVOT:                  Previously seen

 Other:  Male gender previously seen. Heels, Right 5th digit, and  Right Hand
         previously visualized.
Cervix Uterus Adnexa

 Cervix
 Not visualized (advanced GA >74wks)

 Uterus
 No abnormality visualized.

 Right Ovary
 No adnexal mass visualized.
 Left Ovary
 No adnexal mass visualized.

 Cul De Sac
 No free fluid seen.

 Adnexa
 No adnexal mass visualized.
Comments

 This patient was seen for a follow up growth scan as a large
 for gestational age fetus was noted during her last exam.
 The patient reports that she was recently diagnosed with
 cholestasis of pregnancy.  Her bile acid results are currently
 pending.  Her AST (92) and ALT (156) levels were elevated.
 She is treated with Actigall 300 mg 3 times a day.
 She was informed that the fetal growth continues to measure
 large for her gestational [AGE]th percentile).  There was
 normal amniotic fluid noted.
 The patient reports that she has a history of having larger
 babies.  Her largest child was 10 pounds 4 ounces at term.
 She did encounter a slight shoulder dystocia during that
 delivery.
 Fetal movements were noted throughout today's exam.
 Due to the increased risk of fetal demise associated with
 cholestasis of pregnancy, we will continue to follow her with
 weekly BPP's until delivery.  The patient declined to stay for
 an NST today as she reports that she had an NST performed
 at [facility] 2 days ago.
 Delivery for women who have cholestasis of pregnancy is
 recommended at around 37 weeks.
 A BPP was scheduled in 1 week.

## 2023-02-17 IMAGING — US US MFM OB LIMITED
1 series · 14 of 19 positions shown · non-contrast
Comparison: none

[Series 1: us mfm ob limited · 19 acquisitions, 14 frames shown]
[im 1/19]
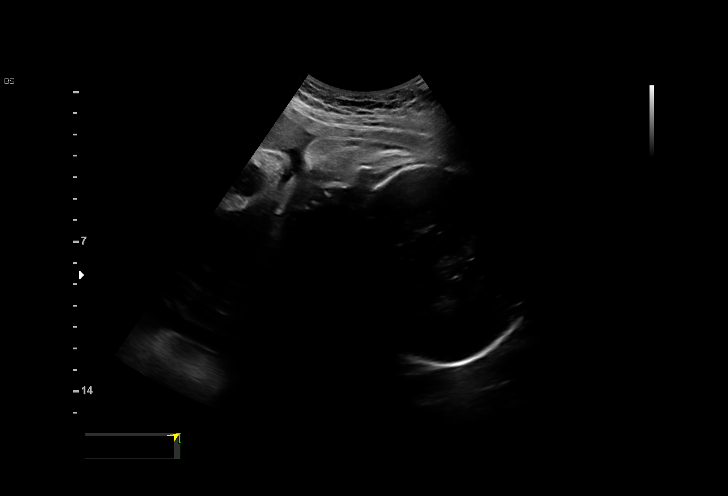
[im 3/19]
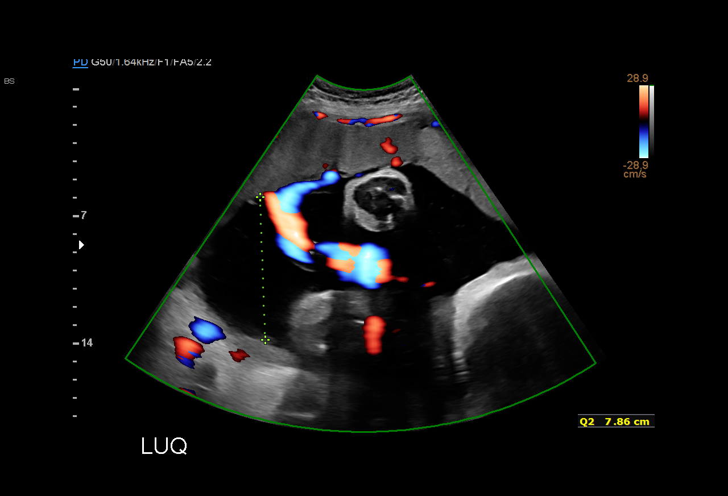
[im 4/19]
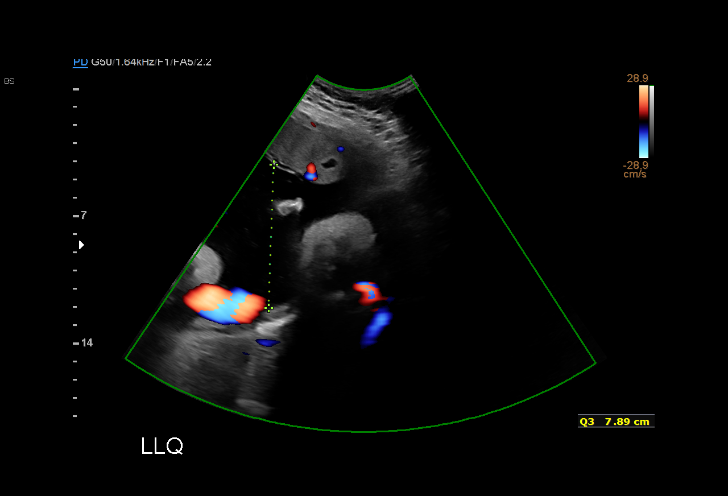
[im 5/19]
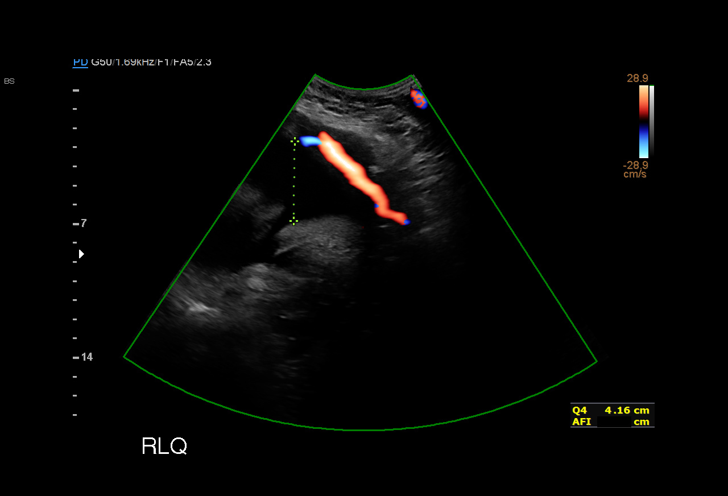
[im 7/19]
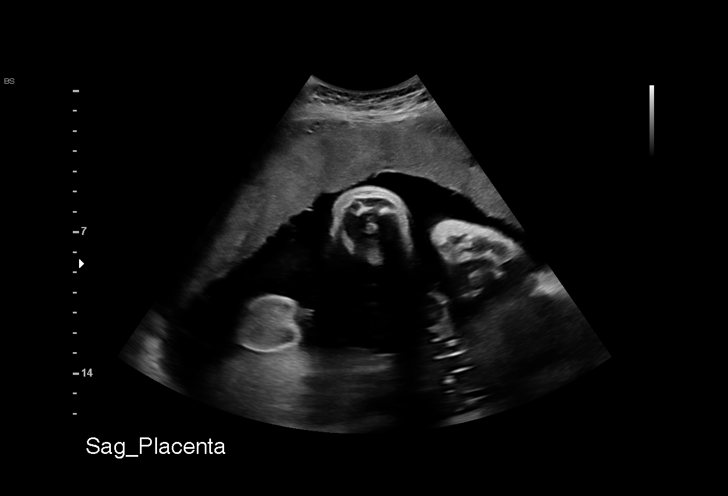
[im 8/19]
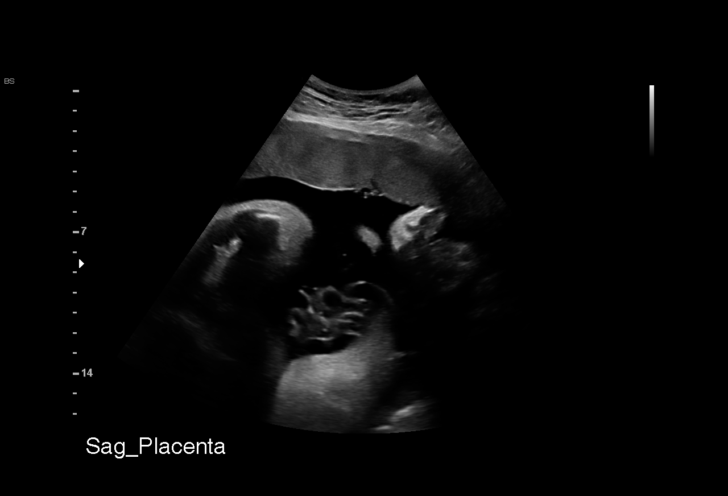
[im 9/19]
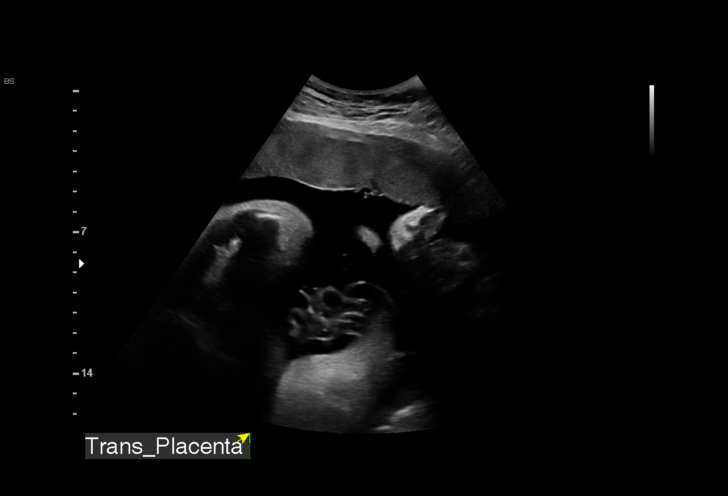
[im 11/19]
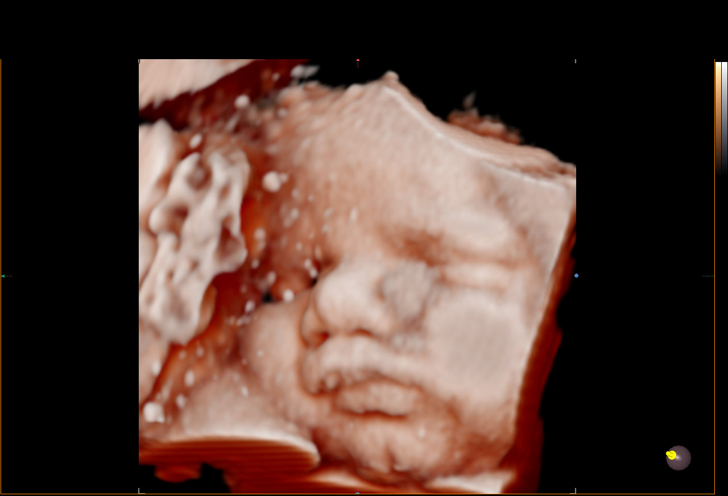
[im 12/19]
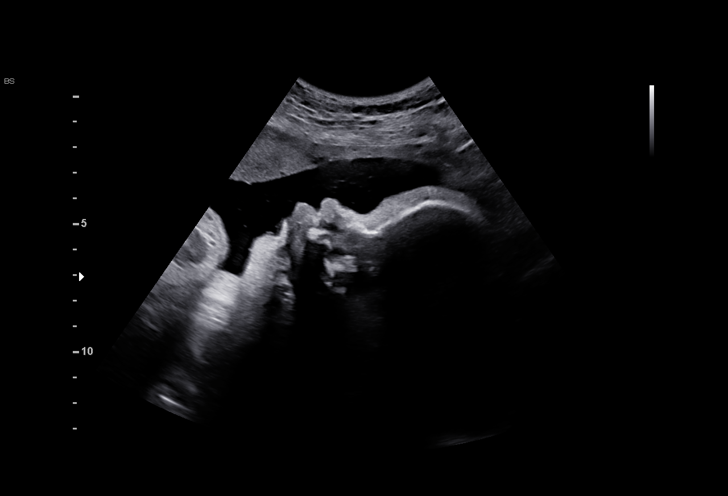
[im 13/19]
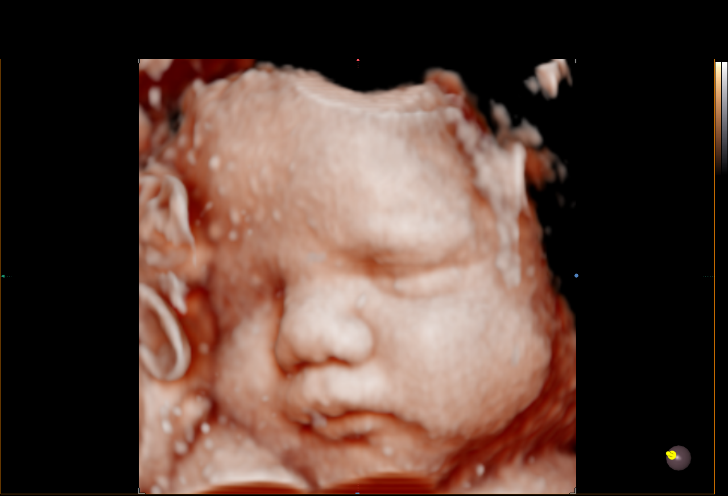
[im 15/19]
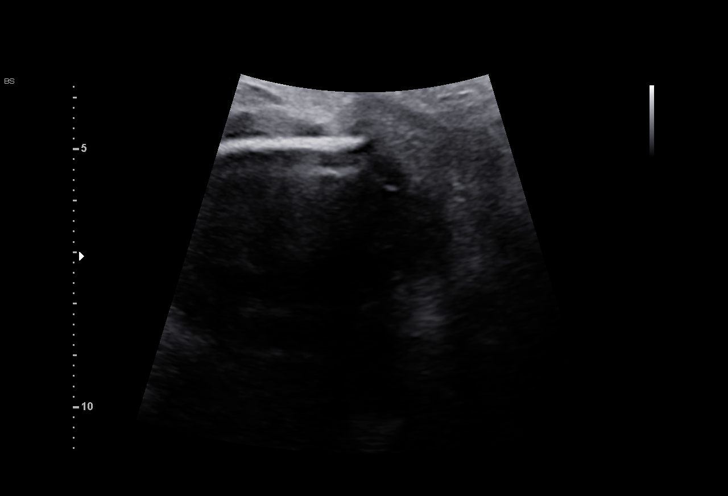
[im 16/19]
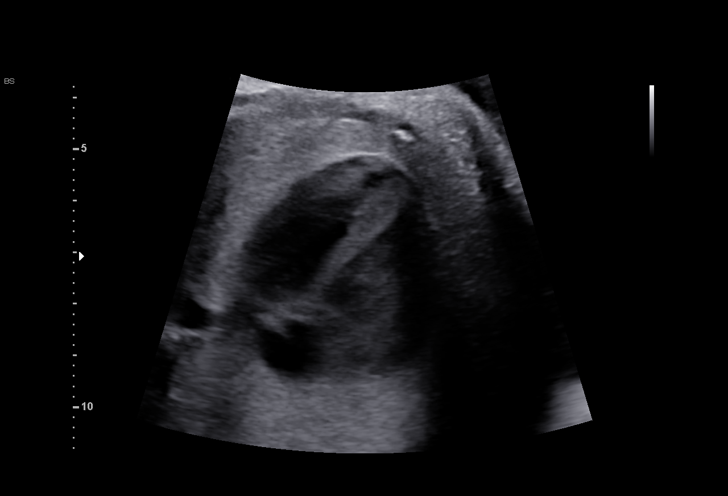
[im 17/19]
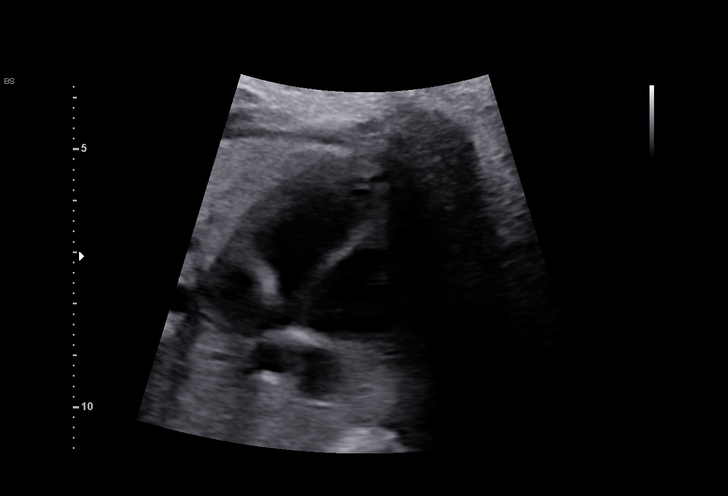
[im 19/19]
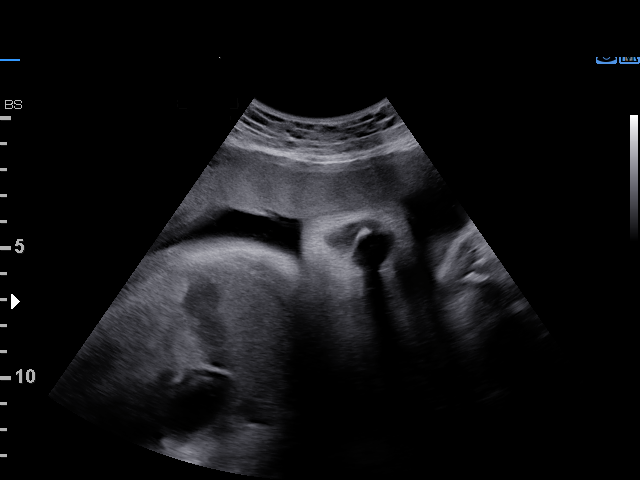

[14 of 19 positions shown; findings below may reference images not displayed]

[id number]

Indications

 Cholestasis of pregnancy, third trimester      [id number]
 33 weeks gestation of pregnancy
 Advanced maternal age multigravida 35+,
 third trimester (35 yo)
 Poor obstetrical history (Shoulder Dystocia)
 Late to prenatal care, third trimester
 Poor obstetric history: Prior fetal
 macrosomia, antepartum (9275 g)
 Encounter for other antenatal screening
 follow-up
Fetal Evaluation

 Num Of Fetuses:         1
 Cardiac Activity:       Observed
 Presentation:           Cephalic
 Placenta:               Anterior
 P. Cord Insertion:      Previously Visualized

 Amniotic Fluid
 AFI FV:      Within normal limits

 AFI Sum(cm)     %Tile       Largest Pocket(cm)
 21.7            82

 RUQ(cm)       RLQ(cm)       LUQ(cm)        LLQ(cm)

OB History

 Gravidity:    6         Term:   3        Prem:   0        SAB:   2
 TOP:          0       Ectopic:  0        Living: 3
Gestational Age

 LMP:           33w 6d        Date:  03/23/21                 EDD:   12/28/21
 Best:          33w 6d     Det. By:  LMP  (03/23/21)          EDD:   12/28/21
Anatomy

 Cranium:               Previously seen        Aortic Arch:            Previously seen
 Cavum:                 Previously seen        Ductal Arch:            Previously seen
 Ventricles:            Previously seen        Diaphragm:              Previously seen
 Choroid Plexus:        Previously seen        Stomach:                Previously Seen
 Cerebellum:            Previously seen        Abdomen:                Previously seen
 Posterior Fossa:       Previously seen        Abdominal Wall:         Previously seen
 Nuchal Fold:           Previously seen        Cord Vessels:           Previously seen
 Face:                  Orbits and profile     Kidneys:                Previously seen
                        previously seen
 Lips:                  Previously seen        Bladder:                Previously seen
 Thoracic:              Previously seen        Spine:                  Previously seen
 Heart:                 Previously seen        Upper Extremities:      Previously seen
 RVOT:                  Previously seen        Lower Extremities:      Previously seen
 LVOT:                  Previously seen

 Other:  Male gender previously seen. Heels, Right 5th digit, and Right Hand
         previously visualized.
Cervix Uterus Adnexa

 Cervix
 Not visualized (advanced GA >66wks)

 Uterus
 No abnormality visualized.

 Right Ovary
 No adnexal mass visualized.

 Left Ovary
 No adnexal mass visualized.

 Cul De Sac
 No free fluid seen.

 Adnexa
 No adnexal mass visualized.
Comments

 This patient was seen for fetal testing due to cholestasis of
 pregnancy that is treated with Actigall.  Her total bile acids
 are still pending.  She denies any problems since her last
 exam and reports feeling vigorous fetal movements
 throughout the day.

 She had a reactive NST today.

 A limited ultrasound performed today shows that the fetus is
 in the vertex presentation.

 There was normal amniotic fluid noted.

 Fetal movements were noted throughout today's exam.
 She will return in 1 week for another BPP.
